# Patient Record
Sex: Male | Born: 1980 | Race: Black or African American | Hispanic: No | Marital: Married
Health system: Southern US, Community
[De-identification: ages and names within clinical notes are randomized; demographics above are authoritative.]

---

## 2011-06-19 DIAGNOSIS — H40003 Preglaucoma, unspecified, bilateral: Secondary | ICD-10-CM | POA: Insufficient documentation

## 2011-07-02 DIAGNOSIS — F17200 Nicotine dependence, unspecified, uncomplicated: Secondary | ICD-10-CM | POA: Insufficient documentation

## 2014-01-12 ENCOUNTER — Emergency Department: Payer: Self-pay | Admitting: Emergency Medicine

## 2014-01-18 DIAGNOSIS — G4451 Hemicrania continua: Secondary | ICD-10-CM | POA: Insufficient documentation

## 2014-03-28 ENCOUNTER — Emergency Department: Payer: Self-pay | Admitting: Emergency Medicine

## 2015-10-22 ENCOUNTER — Emergency Department
Admission: EM | Admit: 2015-10-22 | Discharge: 2015-10-22 | Disposition: A | Discharge status: Home / Self Care | Payer: BLUE CROSS/BLUE SHIELD | Attending: Emergency Medicine | Admitting: Emergency Medicine

## 2015-10-22 ENCOUNTER — Emergency Department: Payer: BLUE CROSS/BLUE SHIELD

## 2015-10-22 DIAGNOSIS — R079 Chest pain, unspecified: Secondary | ICD-10-CM | POA: Diagnosis not present

## 2015-10-22 LAB — CBC
HEMATOCRIT: 45.8 % (ref 40.0–52.0)
HEMOGLOBIN: 15 g/dL (ref 13.0–18.0)
MCH: 28.1 pg (ref 26.0–34.0)
MCHC: 32.7 g/dL (ref 32.0–36.0)
MCV: 85.9 fL (ref 80.0–100.0)
Platelets: 203 10*3/uL (ref 150–440)
RBC: 5.33 MIL/uL (ref 4.40–5.90)
RDW: 13.7 % (ref 11.5–14.5)
WBC: 10.1 10*3/uL (ref 3.8–10.6)

## 2015-10-22 LAB — COMPREHENSIVE METABOLIC PANEL
ALK PHOS: 72 U/L (ref 38–126)
ALT: 24 U/L (ref 17–63)
ANION GAP: 8 (ref 5–15)
AST: 21 U/L (ref 15–41)
Albumin: 4.5 g/dL (ref 3.5–5.0)
BILIRUBIN TOTAL: 0.5 mg/dL (ref 0.3–1.2)
BUN: 8 mg/dL (ref 6–20)
CALCIUM: 9.2 mg/dL (ref 8.9–10.3)
CO2: 25 mmol/L (ref 22–32)
Chloride: 107 mmol/L (ref 101–111)
Creatinine, Ser: 1.05 mg/dL (ref 0.61–1.24)
Glucose, Bld: 104 mg/dL — ABNORMAL HIGH (ref 65–99)
Potassium: 3.5 mmol/L (ref 3.5–5.1)
Sodium: 140 mmol/L (ref 135–145)
TOTAL PROTEIN: 7.8 g/dL (ref 6.5–8.1)

## 2015-10-22 LAB — TROPONIN I: Troponin I: <0.03 ng/mL (ref <0.031)

## 2015-10-22 MED ORDER — FAMOTIDINE 20 MG PO TABS: 20.0000 mg | ORAL_TABLET | Freq: Every day | ORAL | Status: DC

## 2015-10-22 NOTE — ED Provider Notes (Addendum)
[facility] Emergency Department Provider Note     Time seen: ----------------------------------------- 7:25 PM on 10/22/2015 -----------------------------------------    I have reviewed the triage vital signs and the nursing notes.   HISTORY  Chief Complaint Chest Pain    HPI Chris Brennan is a 35 y.o. male who presents ER with mid sternal chest pain started about 2:00 today. Patient denies any fevers, chills, chest pain, shortness of breath, nausea vomiting or diarrhea. Patient is a had this pain before, initially this started after eating lunch. Nothing is made it better or worse.   No past medical history on file.  There are no active problems to display for this patient.   No past surgical history on file.  Allergies Review of patient's allergies indicates no known allergies.  Social History Social History  Substance Use Topics  . Smoking status: Not on file  . Smokeless tobacco: Not on file  . Alcohol Use: Not on file    Review of Systems Constitutional: Negative for fever. Eyes: Negative for visual changes. ENT: Negative for sore throat. Cardiovascular: Positive for chest pain Respiratory: Negative for shortness of breath. Gastrointestinal: Negative for abdominal pain, vomiting and diarrhea. Genitourinary: Negative for dysuria. Musculoskeletal: Negative for back pain. Skin: Negative for rash. Neurological: Negative for headaches, focal weakness or numbness.  10-point ROS otherwise negative.  ____________________________________________   PHYSICAL EXAM:  VITAL SIGNS: ED Triage Vitals  Enc Vitals Group     BP --      Pulse --      Resp --      Temp --      Temp src --      SpO2 --      Weight 10/22/15 1906 225 lb (102.059 kg)     Height 10/22/15 1906 5\' 7"  (1.702 m)     Head Cir --      Peak Flow --      Pain Score 10/22/15 1906 4     Pain Loc --      Pain Edu? --      Excl. in GC? --     Constitutional:  Alert and oriented. Well appearing and in no distress. Eyes: Conjunctivae are normal. PERRL. Normal extraocular movements. ENT   Head: Normocephalic and atraumatic.   Nose: No congestion/rhinnorhea.   Mouth/Throat: Mucous membranes are moist.   Neck: No stridor. Cardiovascular: Normal rate, regular rhythm. Normal and symmetric distal pulses are present in all extremities. No murmurs, rubs, or gallops. Respiratory: Normal respiratory effort without tachypnea nor retractions. Breath sounds are clear and equal bilaterally. No wheezes/rales/rhonchi. Gastrointestinal: Soft and nontender. No distention. No abdominal bruits.  Musculoskeletal: Nontender with normal range of motion in all extremities. No joint effusions.  No lower extremity tenderness nor edema. Neurologic:  Normal speech and language. No gross focal neurologic deficits are appreciated. Speech is normal. No gait instability. Skin:  Skin is warm, dry and intact. No rash noted. Psychiatric: Mood and affect are normal. Speech and behavior are normal. Patient exhibits appropriate insight and judgment. ____________________________________________  EKG: Interpreted by me. Normal sinus rhythm with rate 100 bpm, normal PR interval, normal QRS, normal QT intervals normal axis. Repeat EKG is unchanged with a normal sinus rhythm and normal EKG.  ____________________________________________  ED COURSE:  Pertinent labs & imaging results that were available during my care of the patient were reviewed by me and considered in my medical decision making (see chart for details). Patient is in no acute distress, will  check cardiac markers and chest x-ray ____________________________________________    LABS (pertinent positives/negatives)  Labs Reviewed  COMPREHENSIVE METABOLIC PANEL - Abnormal; Notable for the following:    Glucose, Bld 104 (*)    All other components within normal limits  CBC  TROPONIN I  TROPONIN I     RADIOLOGY Images were viewed by me  Chest x-ray  FINDINGS: The heart size and mediastinal contours are within normal limits. Both lungs are clear. The visualized skeletal structures are unremarkable.  IMPRESSION: Normal chest x-ray. ____________________________________________  FINAL ASSESSMENT AND PLAN  Chest pain  Plan: Patient with labs and imaging as dictated above. Patient is had 2 negative cardiac markers here. He is low risk for acute coronary syndrome by heart score. He'll be referred to cardiology for outpatient follow-up. All placement on Pepcid to start taking every day, this may be GERD.   Emily Filbert, MD   Emily Filbert, MD 10/22/15 2126  Emily Filbert, MD 10/22/15 [id number]

## 2015-10-22 NOTE — Discharge Instructions (Signed)
Nonspecific Chest Pain  °Chest pain can be caused by many different conditions. There is always a chance that your pain could be related to something serious, such as a heart attack or a blood clot in your lungs. Chest pain can also be caused by conditions that are not life-threatening. If you have chest pain, it is very important to follow up with your health care provider. °CAUSES  °Chest pain can be caused by: °· Heartburn. °· Pneumonia or bronchitis. °· Anxiety or stress. °· Inflammation around your heart (pericarditis) or lung (pleuritis or pleurisy). °· A blood clot in your lung. °· A collapsed lung (pneumothorax). It can develop suddenly on its own (spontaneous pneumothorax) or from trauma to the chest. °· Shingles infection (varicella-zoster virus). °· Heart attack. °· Damage to the bones, muscles, and cartilage that make up your chest wall. This can include: °¨ Bruised bones due to injury. °¨ Strained muscles or cartilage due to frequent or repeated coughing or overwork. °¨ Fracture to one or more ribs. °¨ Sore cartilage due to inflammation (costochondritis). °RISK FACTORS  °Risk factors for chest pain may include: °· Activities that increase your risk for trauma or injury to your chest. °· Respiratory infections or conditions that cause frequent coughing. °· Medical conditions or overeating that can cause heartburn. °· Heart disease or family history of heart disease. °· Conditions or health behaviors that increase your risk of developing a blood clot. °· Having had chicken pox (varicella zoster). °SIGNS AND SYMPTOMS °Chest pain can feel like: °· Burning or tingling on the surface of your chest or deep in your chest. °· Crushing, pressure, aching, or squeezing pain. °· Dull or sharp pain that is worse when you move, cough, or take a deep breath. °· Pain that is also felt in your back, neck, shoulder, or arm, or pain that spreads to any of these areas. °Your chest pain may come and go, or it may stay  constant. °DIAGNOSIS °Lab tests or other studies may be needed to find the cause of your pain. Your health care provider may have you take a test called an ambulatory ECG (electrocardiogram). An ECG records your heartbeat patterns at the time the test is performed. You may also have other tests, such as: °· Transthoracic echocardiogram (TTE). During echocardiography, sound waves are used to create a picture of all of the heart structures and to look at how blood flows through your heart. °· Transesophageal echocardiogram (TEE). This is a more advanced imaging test that obtains images from inside your body. It allows your health care provider to see your heart in finer detail. °· Cardiac monitoring. This allows your health care provider to monitor your heart rate and rhythm in real time. °· Holter monitor. This is a portable device that records your heartbeat and can help to diagnose abnormal heartbeats. It allows your health care provider to track your heart activity for several days, if needed. °· Stress tests. These can be done through exercise or by taking medicine that makes your heart beat more quickly. °· Blood tests. °· Imaging tests. °TREATMENT  °Your treatment depends on what is causing your chest pain. Treatment may include: °· Medicines. These may include: °¨ Acid blockers for heartburn. °¨ Anti-inflammatory medicine. °¨ Pain medicine for inflammatory conditions. °¨ Antibiotic medicine, if an infection is present. °¨ Medicines to dissolve blood clots. °¨ Medicines to treat coronary artery disease. °· Supportive care for conditions that do not require medicines. This may include: °¨ Resting. °¨ Applying heat   or cold packs to injured areas. °¨ Limiting activities until pain decreases. °HOME CARE INSTRUCTIONS °· If you were prescribed an antibiotic medicine, finish it all even if you start to feel better. °· Avoid any activities that bring on chest pain. °· Do not use any tobacco products, including  cigarettes, chewing tobacco, or electronic cigarettes. If you need help quitting, ask your health care provider. °· Do not drink alcohol. °· Take medicines only as directed by your health care provider. °· Keep all follow-up visits as directed by your health care provider. This is important. This includes any further testing if your chest pain does not go away. °· If heartburn is the cause for your chest pain, you may be told to keep your head raised (elevated) while sleeping. This reduces the chance that acid will go from your stomach into your esophagus. °· Make lifestyle changes as directed by your health care provider. These may include: °¨ Getting regular exercise. Ask your health care provider to suggest some activities that are safe for you. °¨ Eating a heart-healthy diet. A registered dietitian can help you to learn healthy eating options. °¨ Maintaining a healthy weight. °¨ Managing diabetes, if necessary. °¨ Reducing stress. °SEEK MEDICAL CARE IF: °· Your chest pain does not go away after treatment. °· You have a rash with blisters on your chest. °· You have a fever. °SEEK IMMEDIATE MEDICAL CARE IF:  °· Your chest pain is worse. °· You have an increasing cough, or you cough up blood. °· You have severe abdominal pain. °· You have severe weakness. °· You faint. °· You have chills. °· You have sudden, unexplained chest discomfort. °· You have sudden, unexplained discomfort in your arms, back, neck, or jaw. °· You have shortness of breath at any time. °· You suddenly start to sweat, or your skin gets clammy. °· You feel nauseous or you vomit. °· You suddenly feel light-headed or dizzy. °· Your heart begins to beat quickly, or it feels like it is skipping beats. °These symptoms may represent a serious problem that is an emergency. Do not wait to see if the symptoms will go away. Get medical help right away. Call your local emergency services (911 in the [place] Do not drive yourself to the hospital. °  °This  information is not intended to replace advice given to you by your health care provider. Make sure you discuss any questions you have with your health care provider. °  °Document Released: 07/02/2005 Document Revised: 10/13/2014 Document Reviewed: 04/28/2014 °Elsevier Interactive Patient Education ©2016 [employer] ° °

## 2015-10-22 NOTE — ED Notes (Signed)
Pt in with co mid chest pain that started today at 1400 denies any n.v.d or diaphoresis.

## 2016-01-12 DIAGNOSIS — K921 Melena: Secondary | ICD-10-CM | POA: Insufficient documentation

## 2016-01-12 DIAGNOSIS — I10 Essential (primary) hypertension: Secondary | ICD-10-CM | POA: Insufficient documentation

## 2016-08-09 DIAGNOSIS — Z791 Long term (current) use of non-steroidal anti-inflammatories (NSAID): Secondary | ICD-10-CM | POA: Insufficient documentation

## 2016-12-03 IMAGING — CR DG CHEST 2V
2 series · 2 of 2 positions shown · non-contrast
Comparison: None.

CLINICAL DATA: Hot and cold chills since this morning. Substernal
chest pain since 2 o'clock today.

EXAM:
CHEST  2 VIEW

[chest pa]
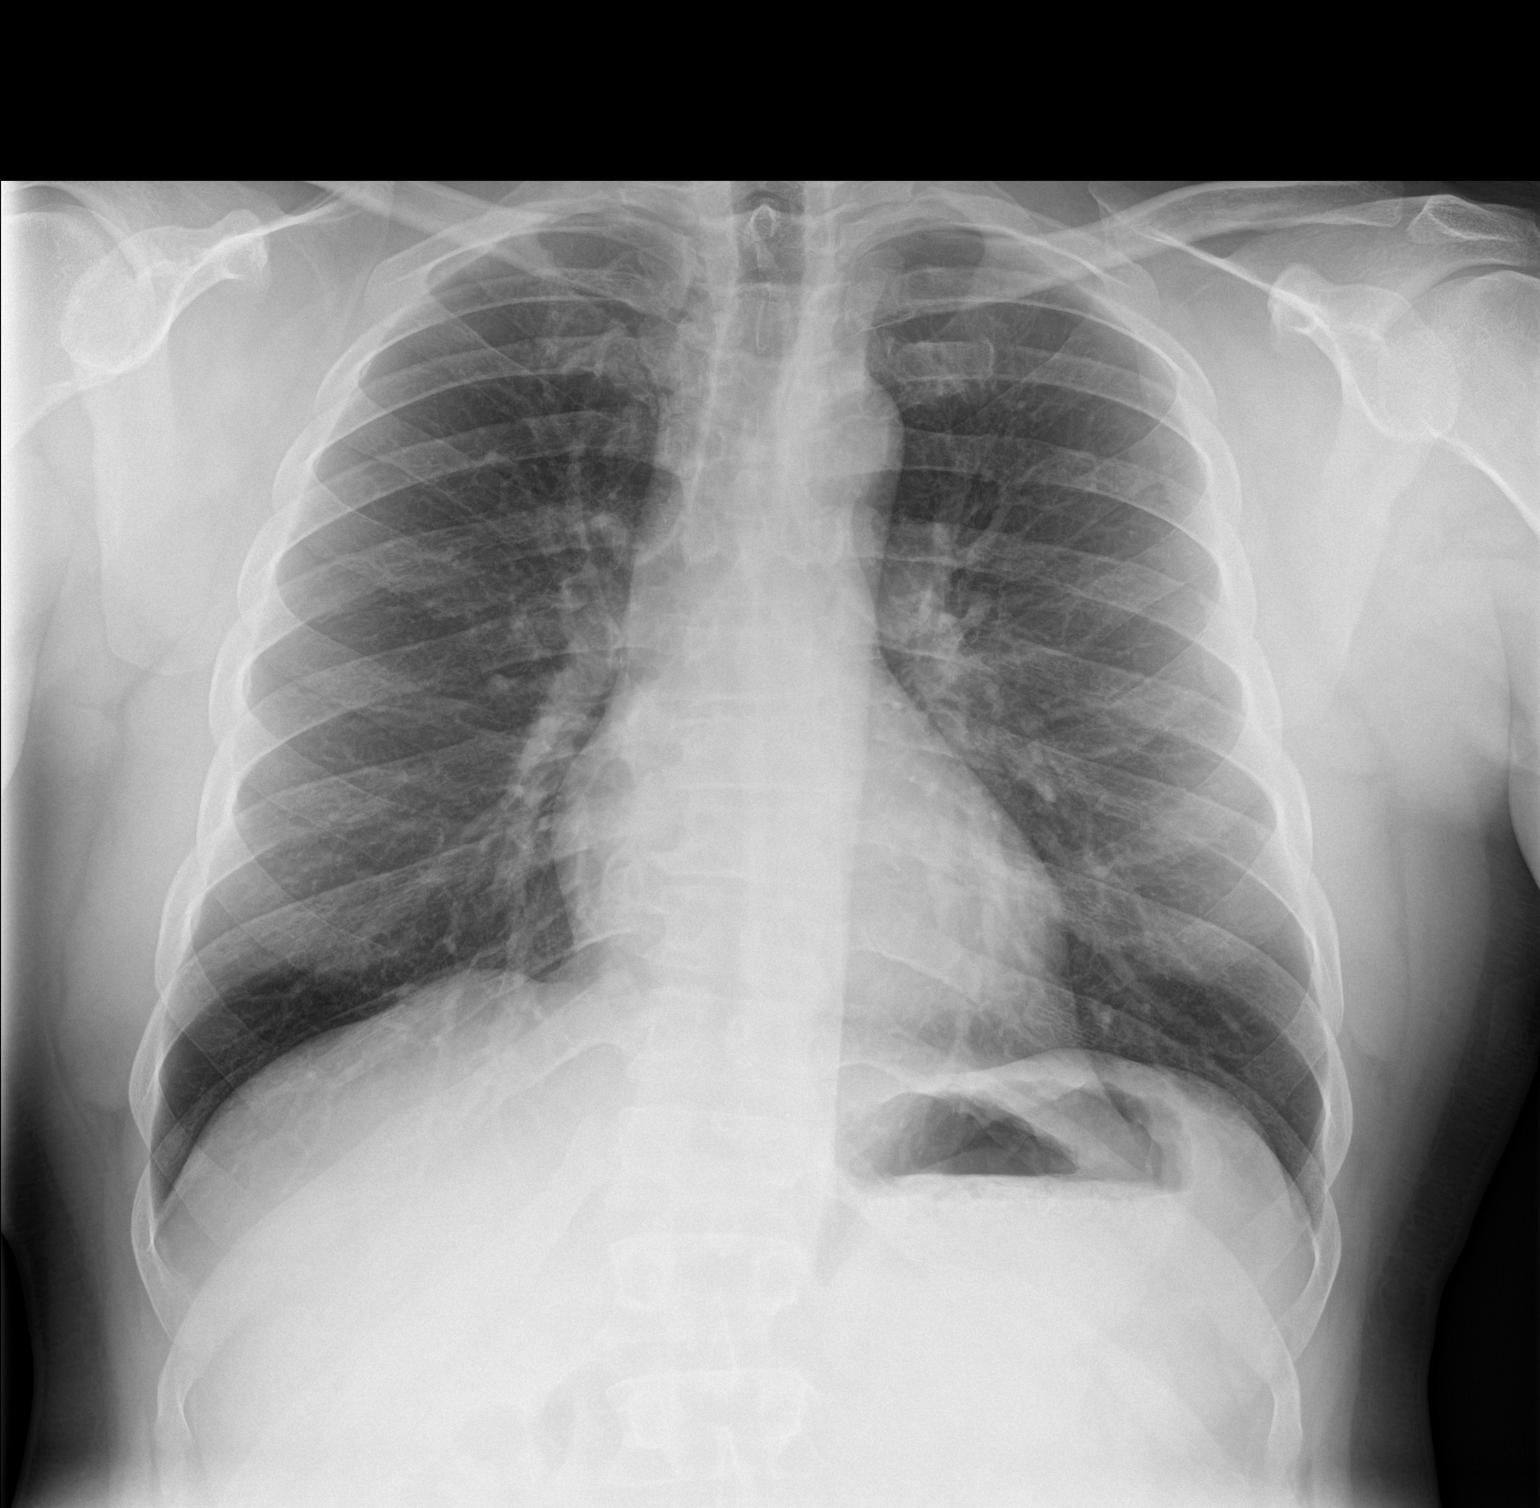

[chest lat]
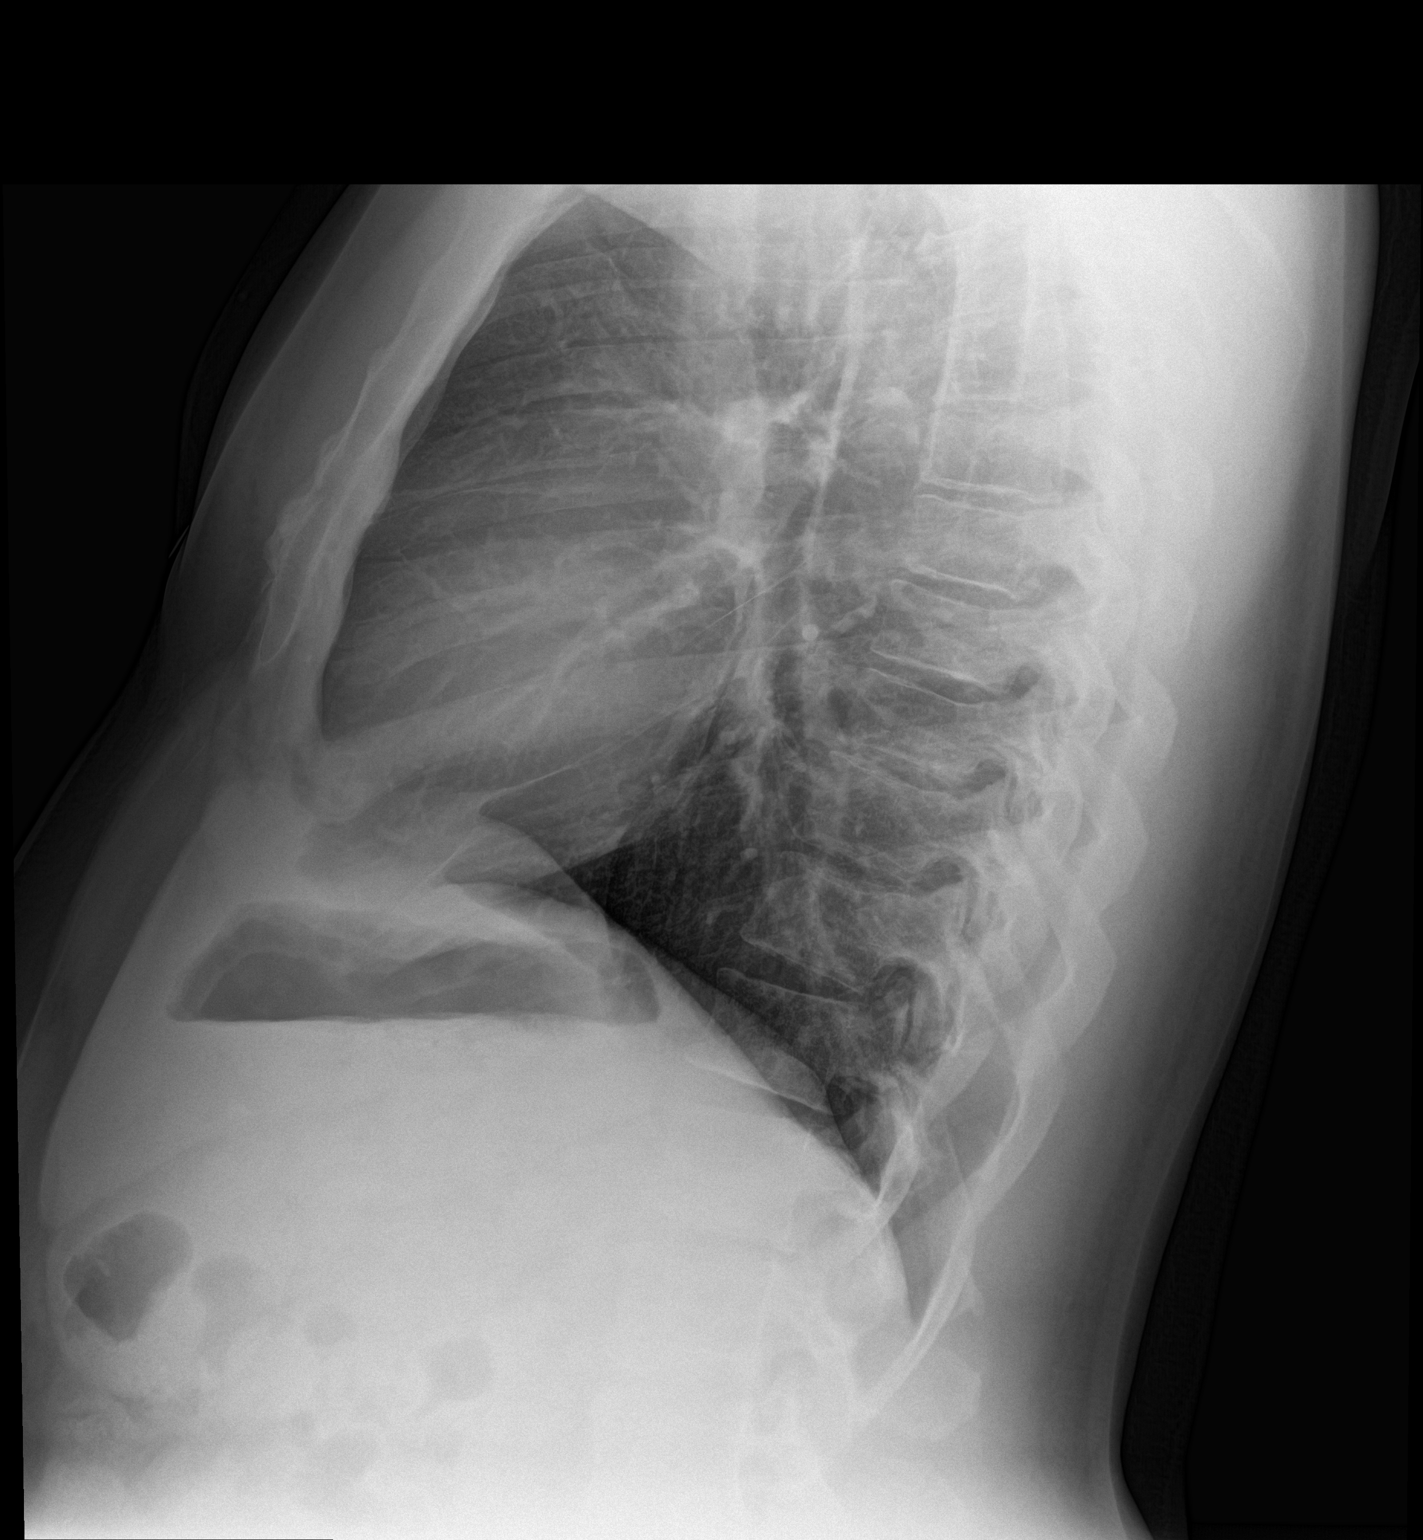

[2 of 2 positions shown; findings below may reference images not displayed]

FINDINGS: The heart size and mediastinal contours are within normal limits.
Both lungs are clear. The visualized skeletal structures are
unremarkable.
IMPRESSION: Normal chest x-ray.

## 2017-05-05 ENCOUNTER — Ambulatory Visit (INDEPENDENT_AMBULATORY_CARE_PROVIDER_SITE_OTHER): Payer: BLUE CROSS/BLUE SHIELD | Admitting: Podiatry

## 2017-05-05 ENCOUNTER — Other Ambulatory Visit: Payer: Self-pay

## 2017-05-05 DIAGNOSIS — L600 Ingrowing nail: Secondary | ICD-10-CM

## 2017-05-05 MED ORDER — DOXYCYCLINE HYCLATE 100 MG PO TABS: 100.0000 mg | ORAL_TABLET | Freq: Two times a day (BID) | ORAL | 0 refills | Status: AC

## 2017-05-05 NOTE — Progress Notes (Signed)
Patient ID: Chris SneddonShannon D Tinch, male   DOB: 1981-04-24, 36 y.o.   MRN: [id number]   Subjective: Patient presents today for evaluation of pain in toe(s). Patient is concerned for possible ingrown nail. Patient states that the pain has been present for a few weeks now. Patient presents today for further treatment and evaluation.  Objective:  General: Well developed, nourished, in no acute distress, alert and oriented x3   Dermatology: Skin is warm, dry and supple bilateral. Medial borders of the bilateral great toes appears to be erythematous with evidence of an ingrowing nail. Pain on palpation noted to the border of the nail fold. The remaining nails appear unremarkable at this time. There are no open sores, lesions.  Vascular: Dorsalis Pedis artery and Posterior Tibial artery pedal pulses palpable. No lower extremity edema noted.   Neruologic: Grossly intact via light touch bilateral.  Musculoskeletal: Muscular strength within normal limits in all groups bilateral. Normal range of motion noted to all pedal and ankle joints.   Assesement: #1 Paronychia with ingrowing nail medial border of the bilateral great toes #2 Pain in toe #3 Incurvated nail  Plan of Care:  1. Patient evaluated.  2. Discussed treatment alternatives and plan of care. Explained nail avulsion procedure and post procedure course to patient. 3. Patient opted for permanent partial nail avulsion.  4. Prior to procedure, local anesthesia infiltration utilized using 3 ml of a 50:50 mixture of 2% plain lidocaine and 0.5% plain marcaine in a normal hallux block fashion and a betadine prep performed.  5. Partial permanent nail avulsion with chemical matrixectomy performed using 3x30sec applications of phenol followed by alcohol flush.  6. Light dressing applied. 7. The patient is going to be In approximately one week. Today were going to prescribe doxycycline 100 mg #20 for preventative treatment 8. Return to clinic when  necessary  [facility] [employer] [facility] for training. 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 Evans, DPM [facility]  Dr. Felecia ShellingBrent M. Evans, DPM    [address]                                        [place], [place] [address]                Office [phone]  Fax [phone]

## 2017-05-05 NOTE — Progress Notes (Signed)
   Subjective:    Patient ID: Chris Brennan, male    DOB: May 21, 1981, 36 y.o.   MRN: [id number]  HPI  Chief Complaint  Patient presents with  . Nail Problem    B/L - Great toes, medial sides, painful x "couple years"        Review of Systems  Constitutional: Negative.   HENT: Negative.   Eyes: Negative.   Respiratory: Negative.   Cardiovascular: Negative.   Gastrointestinal: Negative.   Endocrine: Negative.   Genitourinary: Negative.   Musculoskeletal: Positive for back pain.  Allergic/Immunologic: Negative.   Neurological: Positive for headaches.  Hematological: Negative.   Psychiatric/Behavioral: Negative.        Objective:   Physical Exam        Assessment & Plan:

## 2017-05-19 ENCOUNTER — Ambulatory Visit: Payer: BLUE CROSS/BLUE SHIELD | Admitting: Podiatry

## 2020-04-27 ENCOUNTER — Other Ambulatory Visit: Payer: Self-pay

## 2020-04-27 ENCOUNTER — Ambulatory Visit
Admission: RE | Admit: 2020-04-27 | Discharge: 2020-04-27 | Disposition: A | Discharge status: Home / Self Care | Payer: BLUE CROSS/BLUE SHIELD | Source: Ambulatory Visit | Attending: Nurse Practitioner | Admitting: Nurse Practitioner

## 2020-04-27 ENCOUNTER — Other Ambulatory Visit: Payer: Self-pay | Admitting: Nurse Practitioner

## 2020-04-27 DIAGNOSIS — Z021 Encounter for pre-employment examination: Secondary | ICD-10-CM

## 2021-06-09 IMAGING — CR DG CHEST 1V
1 series · 1 of 1 positions shown · non-contrast
Comparison: 10/22/2015

CLINICAL DATA: Tobacco abuse, pre-employment evaluation

EXAM:
CHEST  1 VIEW

[w chest pa]
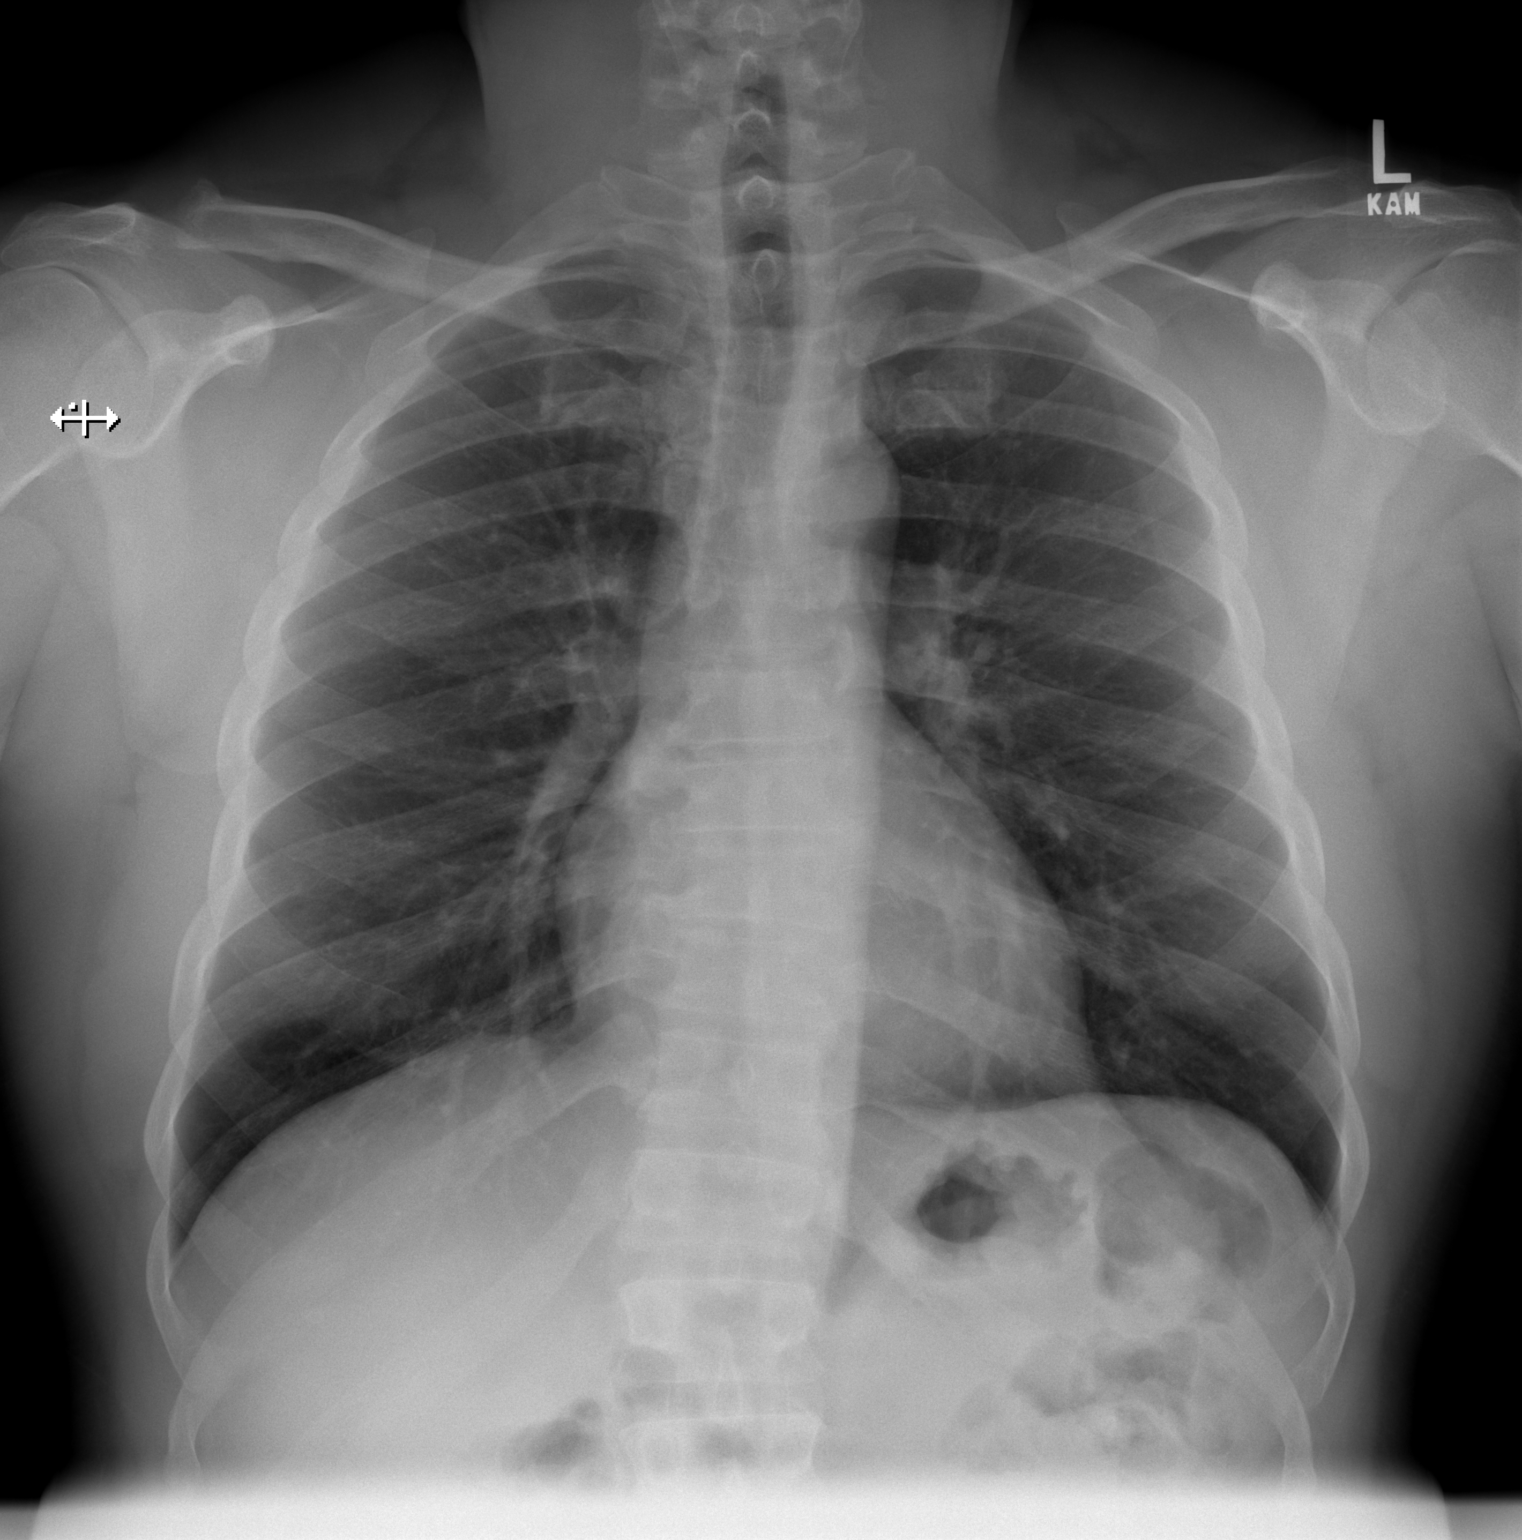

[1 of 1 positions shown; findings below may reference images not displayed]

FINDINGS: The heart size and mediastinal contours are within normal limits.
Both lungs are clear. The visualized skeletal structures are
unremarkable.
IMPRESSION: No active disease.

## 2023-08-21 ENCOUNTER — Other Ambulatory Visit: Payer: Self-pay | Admitting: Family Medicine

## 2023-08-21 DIAGNOSIS — G44091 Other trigeminal autonomic cephalgias (TAC), intractable: Secondary | ICD-10-CM

## 2023-10-16 ENCOUNTER — Encounter: Payer: Self-pay | Admitting: Family Medicine

## 2023-10-22 ENCOUNTER — Ambulatory Visit
Admission: RE | Admit: 2023-10-22 | Discharge: 2023-10-22 | Disposition: A | Discharge status: Home / Self Care | Payer: BC Managed Care – PPO | Source: Ambulatory Visit | Attending: Family Medicine | Admitting: Family Medicine

## 2023-10-22 DIAGNOSIS — G44091 Other trigeminal autonomic cephalgias (TAC), intractable: Secondary | ICD-10-CM

## 2023-10-22 MED ORDER — GADOPICLENOL 0.5 MMOL/ML IV SOLN
10.0000 mL | Freq: Once | INTRAVENOUS | Status: AC | PRN
  Administered 2023-10-22: 10 mL via INTRAVENOUS
